# Patient Record
Sex: Male | Born: 1985 | Hispanic: Yes | Marital: Married | Smoking: Never smoker
Health system: Southern US, Community
[De-identification: ages and names within clinical notes are randomized; demographics above are authoritative.]

---

## 2013-11-12 ENCOUNTER — Emergency Department (INDEPENDENT_AMBULATORY_CARE_PROVIDER_SITE_OTHER)
Admission: EM | Admit: 2013-11-12 | Discharge: 2013-11-12 | Disposition: A | Discharge status: Home / Self Care | Payer: Self-pay | Source: Home / Self Care | Attending: Emergency Medicine | Admitting: Emergency Medicine

## 2013-11-12 ENCOUNTER — Encounter (HOSPITAL_COMMUNITY): Payer: Self-pay | Admitting: Emergency Medicine

## 2013-11-12 DIAGNOSIS — Z23 Encounter for immunization: Secondary | ICD-10-CM

## 2013-11-12 DIAGNOSIS — S91332A Puncture wound without foreign body, left foot, initial encounter: Secondary | ICD-10-CM

## 2013-11-12 DIAGNOSIS — S91309A Unspecified open wound, unspecified foot, initial encounter: Secondary | ICD-10-CM

## 2013-11-12 MED ORDER — TETANUS-DIPHTH-ACELL PERTUSSIS 5-2.5-18.5 LF-MCG/0.5 IM SUSP
INTRAMUSCULAR | Status: AC
  Filled 2013-11-12: qty 0.5

## 2013-11-12 MED ORDER — TETANUS-DIPHTH-ACELL PERTUSSIS 5-2.5-18.5 LF-MCG/0.5 IM SUSP
0.5000 mL | Freq: Once | INTRAMUSCULAR | Status: AC
Start: 2013-11-12 — End: 2013-11-12
  Administered 2013-11-12: 0.5 mL via INTRAMUSCULAR

## 2013-11-12 NOTE — ED Provider Notes (Signed)
Chief Complaint:   Chief Complaint  Patient presents with  . Foot Injury    History of Present Illness:   Vincent Briggs is a 28 year old male who stepped on a clean nail around 10 AM while at work today. This punctured the bottom of his left foot. Bleeding is controlled. There is no evidence of infection. It is mildly tender to palpation to walk on it. He has no swelling of the foot, no fever or chills. He cannot recall his last tetanus vaccine.  Review of Systems:  Other than noted above, the patient denies any of the following symptoms: Systemic:  No fever or chills. Musculoskeletal:  No joint pain or decreased range of motion. Neuro:  No numbness, tingling, or weakness.  PMFSH:  Past medical history, family history, social history, meds, and allergies were reviewed.    Physical Exam:   Vital signs:  BP 117/80  Pulse 69  Temp(Src) 98.4 F (36.9 C) (Oral)  Resp 18  SpO2 95% Ext:  There is a tiny puncture wound on the plantar surface of the left foot overlying the great toe MTP joint, bleeding was controlled, there is no erythema or pain to palpation.  All joints had a full ROM without pain.  Pulses were full.  Good capillary refill in all digits.  No edema. Neurological:  Alert and oriented.  No muscle weakness.  Sensation was intact to light touch.   Course in Urgent Care Center:   Given a Tdap vaccine.  Assessment:  The encounter diagnosis was Puncture wound of left foot.  Plan:   1.  Meds:  The following meds were prescribed:  There are no discharge medications for this patient.   2.  Patient Education/Counseling:  The patient was given appropriate handouts, self care instructions, and instructed in symptomatic relief.  Suggested he watch this with soap and water and apply antibiotic ointment 3 times a day.  3.  Follow up:  The patient was told to follow up if no better in 3 to 4 days, if becoming worse in any way, and given some red flag symptoms such as any sign of  infection which would prompt immediate return.  Follow up here if there is any sign of infection.       Reuben Likesavid C Claude Waldman, MD 11/12/13 [phone]

## 2013-11-12 NOTE — ED Notes (Signed)
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

## 2013-11-12 NOTE — Discharge Instructions (Signed)
Herida por pinchadura  (Puncture Wound)   Una herida punzante es la que atraviesa todas las capas de la piel y del tejido por debajo de la piel(tejido subcutáneo). Una herida punzante puede infectarse con facilidad cuando los gérmenes ingresan al organismo y penetran debajo de la piel durante la lesión. Una herida profunda con un pequeño punto de entrada le hace difícil al médico limpiarla adecuadamente. Por ejemplo cuando se pisa un clavo y éste ha atravesado la suela sucia de un zapato o en otras situaciones en las que la herida se ha contaminado.   CAUSAS   Muchas heridas punzantes se deben a vidrio, clavos, esquirlas, espinas de pescado u otros objetos que han ingresado a la piel (cuerpo extraño). También pueden producirla las mordeduras humanas o de animales.   DIAGNÓSTICO   Generalmente se diagnostica con la historia clínica y el examen físico. Puede ser necesario que le tomen una radiografía o una ecografía para controlar si quedó algún cuerpo extraño en la herida.   TRATAMIENTO   · El médico hará una limpieza lo más profunda posible. Según su ubicación, podrá aplicarle unvendaje.  · El profesional le prescribirá antibióticos.  · Puede ser necesario que concurra a una visita de control. Siga todas las indicaciones del médico.  INSTRUCCIONES PARA EL CUIDADO DOMICILIARIO  · Cambie el vendaje una vez por día o según le indicó el profesional. Si el vendaje se adhiere, podrá retirarlo remojándolo con agua.  · Si el médico se lo ha indicado, es muy importante que vuelva para realizar un control. No cumplir con este control puede dar como resultado que el daño, el dolor o la discapacidad sean permanentes o crónicos.  · Sólo tome medicamentos de venta libre o prescriptos para calmar el dolor, las molestias, o bajar la fiebre según las indicaciones de su médico.  · Si le han recetado antibióticos, tómelos según las indicaciones. Tómelos todos, aunque se sienta mejor.  Deberá aplicarse la vacuna contra el tétanos  si:  · No recuerda cuándo se colocó la vacuna la última vez.  · Nunca recibió esta vacuna.  Si le han aplicado la vacuna contra el tétanos, el brazo podrá hincharse, enrojecer y sentirse caliente al tacto. Esto es frecuente y no es un problema. Si usted necesita aplicarse la vacuna y se niega a recibirla, corre riesgo de contraer tétanos. Ésta es una enfermedad grave.   Si un animal le provocó la herida, debe recibir la vacuna contra la rabia.   SOLICITE ATENCIÓN MÉDICA SI:  · Presenta enrojecimiento, hinchazón o aumento del dolor en la herida.  · Hay rayas rojas que salen de la herida.  · Advierte un olor fétido que proviene de la herida o del vendaje.  · Aparece pus en la herida.  · Se le ha administrado un antibiótico para la infección, pero no parece mejorar.  · Observa algo en la herida como goma del zapato, tela u otro objeto.  · Tiene fiebre.  · Siente dolor intenso.  · Tiene dificultad para respirar.  · Se siente mareado o sufre un desmayo.  · No puede dejar de vomitar.  · Pierde la sensibilidad, hay adormecimiento, o no puede mover el miembro por debajo de la herida.  · Los síntomas empeoran.  ASEGÚRESE DE QUE:   · Comprende estas instrucciones.  · Controlará su enfermedad.  · Solicitará ayuda de inmediato si no mejora o si empeora.  Document Released: 07/16/2005 Document Revised: 12/29/2011  Exit[employer]® Patient Information ©2014 [employer].

## 2014-05-26 ENCOUNTER — Emergency Department (HOSPITAL_COMMUNITY)
Admission: EM | Admit: 2014-05-26 | Discharge: 2014-05-27 | Disposition: A | Discharge status: Home / Self Care | Payer: Self-pay | Attending: Emergency Medicine | Admitting: Emergency Medicine

## 2014-05-26 ENCOUNTER — Other Ambulatory Visit: Payer: Self-pay

## 2014-05-26 ENCOUNTER — Emergency Department (HOSPITAL_COMMUNITY): Payer: Self-pay

## 2014-05-26 ENCOUNTER — Encounter (HOSPITAL_COMMUNITY): Payer: Self-pay | Admitting: Emergency Medicine

## 2014-05-26 DIAGNOSIS — R0602 Shortness of breath: Secondary | ICD-10-CM | POA: Insufficient documentation

## 2014-05-26 DIAGNOSIS — R0789 Other chest pain: Secondary | ICD-10-CM | POA: Insufficient documentation

## 2014-05-26 DIAGNOSIS — R079 Chest pain, unspecified: Secondary | ICD-10-CM | POA: Insufficient documentation

## 2014-05-26 DIAGNOSIS — F411 Generalized anxiety disorder: Secondary | ICD-10-CM | POA: Insufficient documentation

## 2014-05-26 DIAGNOSIS — R61 Generalized hyperhidrosis: Secondary | ICD-10-CM | POA: Insufficient documentation

## 2014-05-26 LAB — CBC
HCT: 37 % — ABNORMAL LOW (ref 39.0–52.0)
Hemoglobin: 13.3 g/dL (ref 13.0–17.0)
MCH: 33.3 pg (ref 26.0–34.0)
MCHC: 35.9 g/dL (ref 30.0–36.0)
MCV: 92.5 fL (ref 78.0–100.0)
PLATELETS: 209 10*3/uL (ref 150–400)
RBC: 4 MIL/uL — ABNORMAL LOW (ref 4.22–5.81)
RDW: 12.2 % (ref 11.5–15.5)
WBC: 6.7 10*3/uL (ref 4.0–10.5)

## 2014-05-26 LAB — BASIC METABOLIC PANEL
ANION GAP: 15 (ref 5–15)
BUN: 9 mg/dL (ref 6–23)
CALCIUM: 8.8 mg/dL (ref 8.4–10.5)
CHLORIDE: 105 meq/L (ref 96–112)
CO2: 23 mEq/L (ref 19–32)
Creatinine, Ser: 0.75 mg/dL (ref 0.50–1.35)
GFR calc non Af Amer: >90 mL/min (ref >90)
Glucose, Bld: 127 mg/dL — ABNORMAL HIGH (ref 70–99)
Potassium: 4 mEq/L (ref 3.7–5.3)
Sodium: 143 mEq/L (ref 137–147)

## 2014-05-26 LAB — I-STAT TROPONIN, ED: Troponin i, poc: 0 ng/mL (ref 0.00–0.08)

## 2014-05-26 LAB — PRO B NATRIURETIC PEPTIDE: Pro B Natriuretic peptide (BNP): 17.5 pg/mL (ref 0–125)

## 2014-05-26 NOTE — ED Provider Notes (Signed)
CSN: [id number]     Arrival date & time 05/26/14  2230 History   First MD Initiated Contact with Patient 05/26/14 2302     Chief Complaint  Patient presents with  . Chest Pain     (Consider location/radiation/quality/duration/timing/severity/associated sxs/prior Treatment) HPI Patient presents with acute onset left-sided burning chest pain and shortness of breath starting roughly one hour prior to presentation. The pain is not worse with deep inspiration. Not worse with movement or palpation. He states is currently resolved. He still has mild shortness of breath. He patient is a nonsmoker with no past medical history. He denies any recent drug or caffeine use. He's had no extended travel or recent surgeries. He has no personal or family history of coronary artery disease or thromboembolic disease. Denies any lower extremity swelling or pain. He denies any cough, fever or chills. Patient was tremulous and diaphoretic on arrival. He states he is feeling much better now. History reviewed. No pertinent past medical history. History reviewed. No pertinent past surgical history. History reviewed. No pertinent family history. History  Substance Use Topics  . Smoking status: Never Smoker   . Smokeless tobacco: Not on file  . Alcohol Use: No    Review of Systems  Constitutional: Positive for diaphoresis. Negative for fever and chills.  HENT: Negative for sore throat.   Respiratory: Positive for shortness of breath. Negative for cough, chest tightness and wheezing.   Cardiovascular: Positive for chest pain. Negative for palpitations and leg swelling.  Gastrointestinal: Negative for nausea, vomiting and abdominal pain.  Musculoskeletal: Negative for back pain, myalgias, neck pain and neck stiffness.  Skin: Negative for rash and wound.  Neurological: Negative for dizziness, weakness, light-headedness, numbness and headaches.  All other systems reviewed and are negative.     Allergies  Review  of patient's allergies indicates no known allergies.  Home Medications   Prior to Admission medications   Not on File   BP 129/70  Pulse 70  Resp 15  SpO2 100% Physical Exam  Nursing note and vitals reviewed. Constitutional: He is oriented to person, place, and time. He appears well-developed and well-nourished. No distress.  Mildly anxious  HENT:  Head: Normocephalic and atraumatic.  Mouth/Throat: Oropharynx is clear and moist. No oropharyngeal exudate.  Eyes: EOM are normal. Pupils are equal, round, and reactive to light.  Neck: Normal range of motion. Neck supple. No thyromegaly present.  Cardiovascular: Normal rate and regular rhythm.  Exam reveals no gallop and no friction rub.   No murmur heard. Pulmonary/Chest: Effort normal and breath sounds normal. No respiratory distress. He has no wheezes. He has no rales. He exhibits no tenderness.  Abdominal: Soft. Bowel sounds are normal. He exhibits no distension and no mass. There is no tenderness. There is no rebound and no guarding.  Musculoskeletal: Normal range of motion. He exhibits no edema and no tenderness.  No calf swelling or tenderness.  Lymphadenopathy:    He has no cervical adenopathy.  Neurological: He is alert and oriented to person, place, and time.  Moves all extremities without deficit. Sensation is intact. No tremulousness noted  Skin: Skin is warm and dry. No rash noted. No erythema.  Psychiatric: His behavior is normal.  Mild anxiety    ED Course  Procedures (including critical care time) Labs Review Labs Reviewed  CBC - Abnormal; Notable for the following:    RBC 4.00 (*)    HCT 37.0 (*)    All other components within normal limits  BASIC  METABOLIC PANEL - Abnormal; Notable for the following:    Glucose, Bld 127 (*)    All other components within normal limits  PRO B NATRIURETIC PEPTIDE  D-DIMER, QUANTITATIVE  I-STAT TROPOININ, ED    Imaging Review Dg Chest Port 1 View  05/26/2014   CLINICAL  DATA:  Chest pain and shortness of breath. Weakness. History of smoking.  EXAM: PORTABLE CHEST - 1 VIEW  COMPARISON:  None.  FINDINGS: The lungs are well-aerated and clear. There is no evidence of focal opacification, pleural effusion or pneumothorax.  The cardiomediastinal silhouette is within normal limits. No acute osseous abnormalities are seen.  IMPRESSION: No acute cardiopulmonary process seen.   Electronically Signed   By: Roanna RaiderJeffery  Chang M.D.   On: 05/26/2014 23:23     EKG Interpretation None      Date: 05/27/2014  Rate:74  Rhythm: normal sinus rhythm  QRS Axis: right  Intervals: normal  ST/T Wave abnormalities: normal  Conduction Disutrbances:none  Narrative Interpretation:   Old EKG Reviewed: unchanged EKG is identical to previous EKG.  MDM   Final diagnoses:  None    Patient remains asymptomatic in the emergency department. His vital signs remained stable. He has a normal EKG. He has a normal troponin x2. He has a normal d-dimer. He has no risk factors for coronary artery disease. He has perc negative. I have low suspicion for either processes. Patient was anxious appearing on presentation. I suspect anxiety and stress are responsible for his symptoms or possibly musculoskeletal in origin. He's been instructed to followup with cardiology and given strict return precautions.    Loren Raceravid Aubert Choyce, MD 05/27/14 [phone]

## 2014-05-26 NOTE — ED Notes (Signed)
Pt presents with a new onset on Left side chest pain, weakness, and SOB. Pt reports his symptoms started approx 1 hour ago. Pt states nothing makes the pain better or worse, pt denies any injury. EKG completed in triage. Pt arrived with tremors and diaphoretic.

## 2014-05-27 LAB — I-STAT TROPONIN, ED: Troponin i, poc: 0 ng/mL (ref 0.00–0.08)

## 2014-05-27 LAB — D-DIMER, QUANTITATIVE: D-Dimer, Quant: <0.27 ug/mL-FEU (ref 0.00–0.48)

## 2014-05-27 MED ORDER — IBUPROFEN 600 MG PO TABS: 600.0000 mg | ORAL_TABLET | Freq: Four times a day (QID) | ORAL | Status: AC | PRN

## 2014-05-27 NOTE — Discharge Instructions (Signed)
Dolor de pecho (no específico) °(Chest Pain (Nonspecific)) °Con frecuencia es difícil dar un diagnóstico específico de la causa del dolor de pecho. Siempre hay una posibilidad de que el dolor podría estar relacionado con algo grave, como un ataque al corazón o un coágulo sanguíneo en los pulmones. Debe someterse a controles con el médico para más evaluaciones. °CAUSAS  °· Acidez. °· Neumonía o bronquitis. °· Ansiedad o estrés. °· Inflamación de la zona que rodea al corazón (pericarditis) o a los pulmones (pleuritis o pleuresía). °· Un coágulo sanguíneo en el pulmón. °· Colapso de un pulmón (neumotórax), que puede aparecer de manera repentina por sí solo (neumotórax espontáneo) o debido a un traumatismo en el tórax. °· Culebrilla (virus del herpes zóster). °La pared torácica está compuesta por huesos, músculos y cartílago. Cualquiera de estos puede ser la fuente del dolor. °· Puede haber una contusión en los huesos debido a una lesión. °· Puede haber un esguince en los músculos o el cartílago ocasionado por la tos o por trabajo excesivo. °· El cartílago puede verse afectado por una inflamación y provocar dolor (costocondritis). °DIAGNÓSTICO  °Quizás se necesiten análisis de laboratorio u otros estudios para encontrar la causa del dolor. Además, puede indicarle que se haga una prueba llamada electrocadiograma (ECG) ambulatorio. El ECG registra los patrones de los latidos cardíacos durante 24 horas. Además, pueden hacerle otros estudios, por ejemplo: °· Ecocardiograma transtorácico (ETT). Durante el ecocardiograma, se usan ondas sonoras para evaluar el flujo de la sangre a través del corazón. °· Ecocardiograma transesofágico (ETE). °· Monitoreo cardíaco. Permite que el médico controle la frecuencia y el ritmo cardíaco en tiempo real. °· Monitor Holter. Es un dispositivo portátil que registra los latidos cardíacos y ayuda a diagnosticar las arritmias cardíacas. Le permite al médico registrar la actividad cardíaca  durante varios días, si es necesario. °· Pruebas de estrés por ejercicio o por medicamentos que aceleran los latidos cardíacos. °TRATAMIENTO  °· El tratamiento depende de la causa del dolor de pecho. El tratamiento puede incluir: °¨ Inhibidores de la acidez estomacal. °¨ Antiinflamatorios. °¨ Analgésicos para las enfermedades inflamatorias. °¨ Antibióticos, si hay una infección. °· Podrán aconsejarle que modifique su estilo de vida. Esto incluye dejar de fumar y evitar el alcohol, la cafeína y el chocolate. °· Pueden aconsejarle que mantenga la cabeza levantada (elevada) cuando duerme. Esto reduce la probabilidad de que el ácido retroceda del estómago al esófago. °En la mayoría de los casos, el dolor de pecho no específico mejorará en el término de 2 a 3 días, con reposo y analgésicos suaves.  °INSTRUCCIONES PARA EL CUIDADO EN EL HOGAR  °· Si le prescriben antibióticos, tómelos tal como se le indicó. Termínelos aunque comience a sentirse mejor. °· Durante los días siguientes, no haga actividades físicas que provoquen dolor de pecho. Continúe con las actividades físicas tal como se le indicó °· No consuma ningún producto que contenga tabaco, incluidos cigarrillos, tabaco de mascar o cigarrillos electrónicos. °· Evite el consumo de alcohol. °· Tome los medicamentos solamente como se lo haya indicado el médico. °· Siga las sugerencias del médico en lo que respecta a las pruebas adicionales, si el dolor de pecho no desaparece. °· Concurra a todas las visitas de control programadas. Si no lo hace, podría desarrollar problemas permanentes (crónicos) relacionados con el dolor. Si hay algún problema para concurrir a una cita, llame para reprogramarla. °SOLICITE ATENCIÓN MÉDICA SI:  °· El dolor de pecho no desaparece, incluso después del tratamiento. °· Tiene una erupción cutánea con ampollas en el   pecho. °· Tiene fiebre. °SOLICITE ATENCIÓN MÉDICA DE INMEDIATO SI:  °· Aumenta el dolor de pecho o este se irradia hacia el  brazo, el cuello, la mandíbula, la espalda o el abdomen. °· Le falta el aire. °· La tos empeora, o expectora sangre. °· Siente dolor intenso en la espalda o el abdomen. °· Se siente nauseoso o vomita. °· Siente debilidad intensa. °· Se desmaya. °· Tiene escalofríos. °Esto es una emergencia. No espere a ver si el dolor se pasa. Obtenga ayuda médica de inmediato. Llame a los servicios de emergencia locales (911 en los [place]). No conduzca por sus propios medios hasta el hospital. °ASEGÚRESE DE QUE:  °· Comprende estas instrucciones. °· Controlará su afección. °· Recibirá ayuda de inmediato si no mejora o si empeora. °Document Released: 10/06/2005 Document Revised: 10/11/2013 °ExitCare® Patient Information ©2015 [employer]. This information is not intended to replace advice given to you by your health care provider. Make sure you discuss any questions you have with your health care provider. ° °

## 2016-03-27 IMAGING — CR DG CHEST 1V PORT
1 series · 1 of 1 positions shown · non-contrast
Comparison: None.

CLINICAL DATA: Chest pain and shortness of breath. Weakness.
History of smoking.

EXAM:
PORTABLE CHEST - 1 VIEW

[AP]
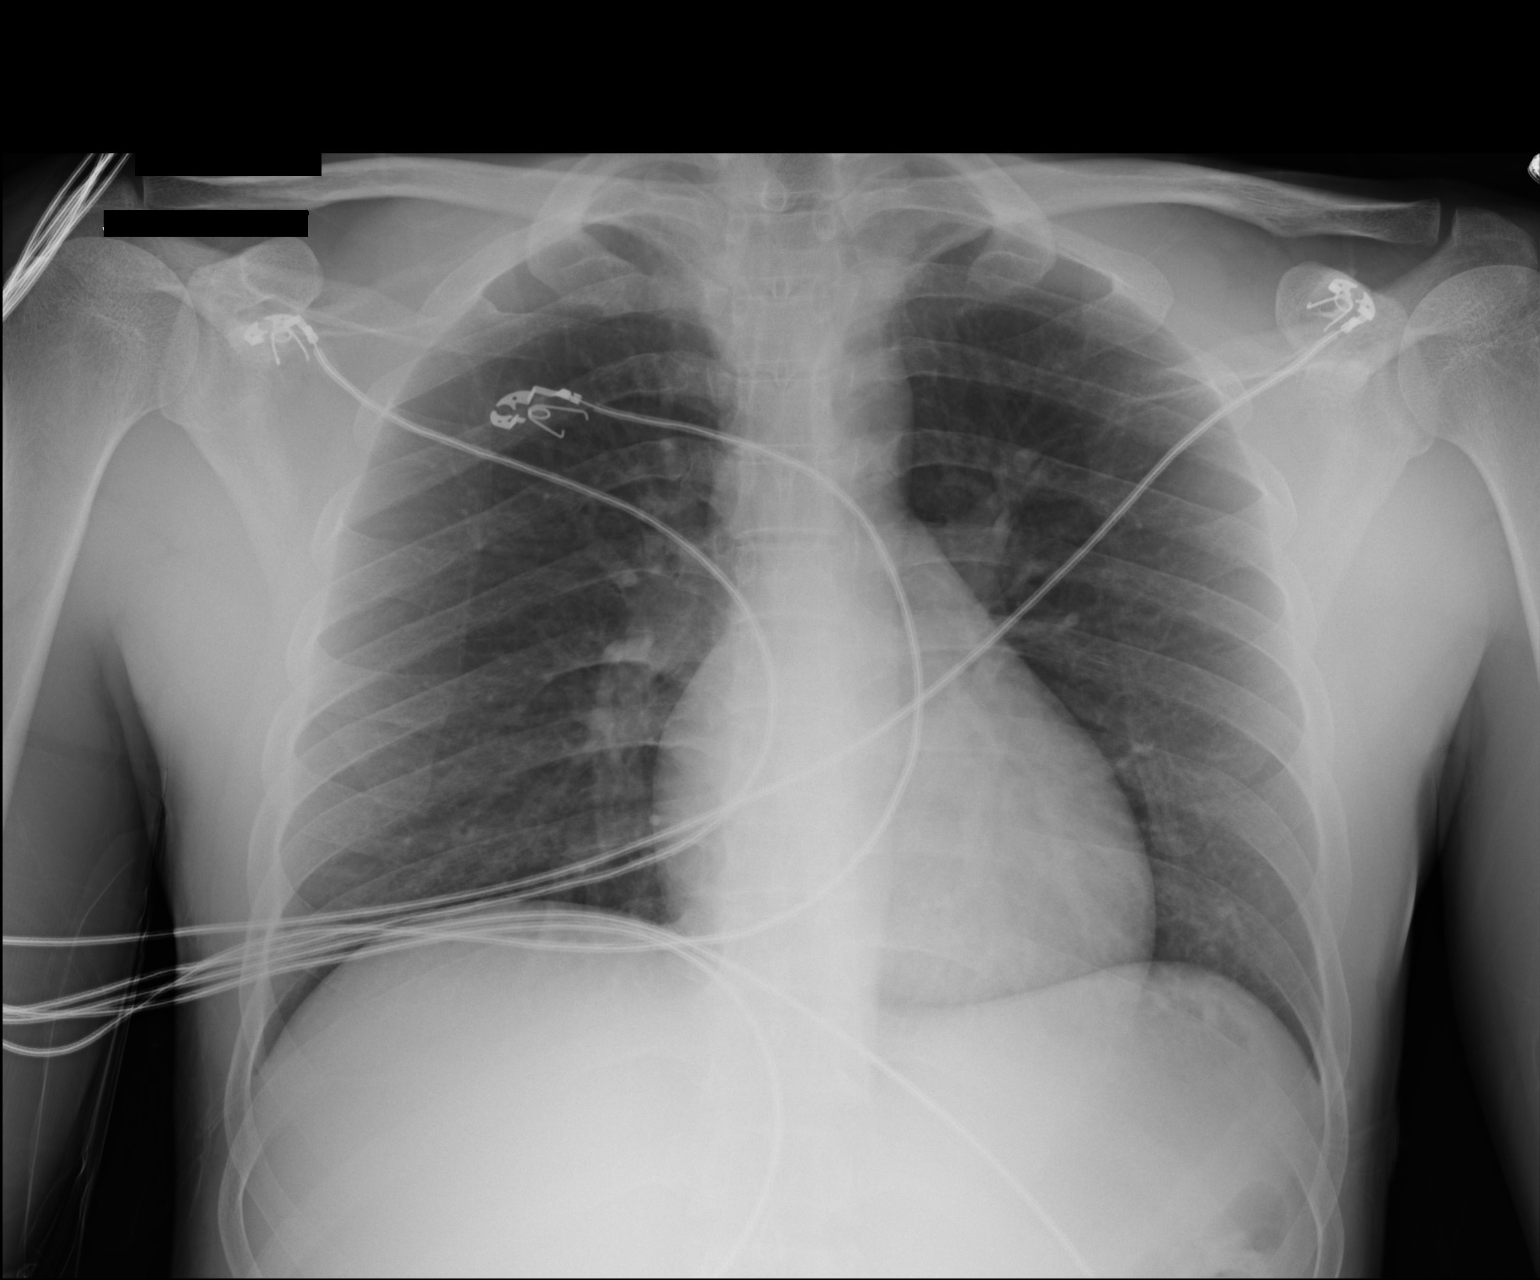

[1 of 1 positions shown; findings below may reference images not displayed]

FINDINGS: The lungs are well-aerated and clear. There is no evidence of focal
opacification, pleural effusion or pneumothorax.

The cardiomediastinal silhouette is within normal limits. No acute
osseous abnormalities are seen.
IMPRESSION: No acute cardiopulmonary process seen.
